# Patient Record
Sex: Female | Born: 1940 | Race: White | Hispanic: No | Smoking: Former smoker
Health system: Southern US, Community
[De-identification: ages and names within clinical notes are randomized; demographics above are authoritative.]

## PROBLEM LIST (undated history)

## (undated) DIAGNOSIS — F32A Depression, unspecified: Secondary | ICD-10-CM

## (undated) DIAGNOSIS — E039 Hypothyroidism, unspecified: Secondary | ICD-10-CM

## (undated) DIAGNOSIS — M1990 Unspecified osteoarthritis, unspecified site: Secondary | ICD-10-CM

## (undated) DIAGNOSIS — H8109 Meniere's disease, unspecified ear: Secondary | ICD-10-CM

## (undated) DIAGNOSIS — I10 Essential (primary) hypertension: Secondary | ICD-10-CM

## (undated) DIAGNOSIS — K219 Gastro-esophageal reflux disease without esophagitis: Secondary | ICD-10-CM

## (undated) DIAGNOSIS — G8929 Other chronic pain: Secondary | ICD-10-CM

## (undated) DIAGNOSIS — F419 Anxiety disorder, unspecified: Secondary | ICD-10-CM

## (undated) DIAGNOSIS — F329 Major depressive disorder, single episode, unspecified: Secondary | ICD-10-CM

## (undated) DIAGNOSIS — H9190 Unspecified hearing loss, unspecified ear: Secondary | ICD-10-CM

## (undated) DIAGNOSIS — M25511 Pain in right shoulder: Secondary | ICD-10-CM

## (undated) HISTORY — PX: HAND SURGERY: SHX662

## (undated) HISTORY — PX: APPENDECTOMY: SHX54

## (undated) HISTORY — PX: JOINT REPLACEMENT: SHX530

## (undated) HISTORY — PX: BREAST MASS EXCISION: SHX1267

## (undated) HISTORY — PX: EYE SURGERY: SHX253

## (undated) HISTORY — PX: CHOLECYSTECTOMY: SHX55

---

## 2014-02-20 ENCOUNTER — Other Ambulatory Visit: Payer: Self-pay | Admitting: Orthopedic Surgery

## 2014-02-20 DIAGNOSIS — M545 Low back pain: Secondary | ICD-10-CM

## 2014-03-02 ENCOUNTER — Ambulatory Visit
Admission: RE | Admit: 2014-03-02 | Discharge: 2014-03-02 | Disposition: A | Discharge status: Home / Self Care | Payer: Medicare Other | Source: Ambulatory Visit | Attending: Orthopedic Surgery | Admitting: Orthopedic Surgery

## 2014-03-02 DIAGNOSIS — M545 Low back pain: Secondary | ICD-10-CM

## 2014-06-10 ENCOUNTER — Other Ambulatory Visit: Payer: Self-pay | Admitting: Orthopedic Surgery

## 2014-06-10 DIAGNOSIS — M25511 Pain in right shoulder: Secondary | ICD-10-CM

## 2014-06-13 ENCOUNTER — Ambulatory Visit
Admission: RE | Admit: 2014-06-13 | Discharge: 2014-06-13 | Disposition: A | Discharge status: Home / Self Care | Payer: Medicare Other | Source: Ambulatory Visit | Attending: Orthopedic Surgery | Admitting: Orthopedic Surgery

## 2014-06-13 DIAGNOSIS — M25511 Pain in right shoulder: Secondary | ICD-10-CM

## 2014-06-22 ENCOUNTER — Other Ambulatory Visit: Payer: Medicare Other

## 2014-06-23 ENCOUNTER — Other Ambulatory Visit: Payer: Self-pay | Admitting: Physician Assistant

## 2014-07-01 ENCOUNTER — Encounter (HOSPITAL_BASED_OUTPATIENT_CLINIC_OR_DEPARTMENT_OTHER): Payer: Self-pay | Admitting: *Deleted

## 2014-07-01 ENCOUNTER — Other Ambulatory Visit: Payer: Self-pay

## 2014-07-01 ENCOUNTER — Encounter (HOSPITAL_BASED_OUTPATIENT_CLINIC_OR_DEPARTMENT_OTHER)
Admission: RE | Admit: 2014-07-01 | Discharge: 2014-07-01 | Disposition: A | Discharge status: Home / Self Care | Payer: Medicare Other | Source: Ambulatory Visit | Attending: Orthopedic Surgery | Admitting: Orthopedic Surgery

## 2014-07-01 DIAGNOSIS — K219 Gastro-esophageal reflux disease without esophagitis: Secondary | ICD-10-CM | POA: Diagnosis not present

## 2014-07-01 DIAGNOSIS — S4991XA Unspecified injury of right shoulder and upper arm, initial encounter: Secondary | ICD-10-CM | POA: Diagnosis not present

## 2014-07-01 DIAGNOSIS — M19011 Primary osteoarthritis, right shoulder: Secondary | ICD-10-CM | POA: Diagnosis not present

## 2014-07-01 DIAGNOSIS — I10 Essential (primary) hypertension: Secondary | ICD-10-CM | POA: Diagnosis not present

## 2014-07-01 DIAGNOSIS — X58XXXA Exposure to other specified factors, initial encounter: Secondary | ICD-10-CM | POA: Diagnosis not present

## 2014-07-01 DIAGNOSIS — Z87891 Personal history of nicotine dependence: Secondary | ICD-10-CM | POA: Diagnosis not present

## 2014-07-01 DIAGNOSIS — E039 Hypothyroidism, unspecified: Secondary | ICD-10-CM | POA: Diagnosis not present

## 2014-07-01 LAB — BASIC METABOLIC PANEL
Anion gap: 6 (ref 5–15)
BUN: 18 mg/dL (ref 6–20)
CO2: 30 mmol/L (ref 22–32)
Calcium: 8.8 mg/dL — ABNORMAL LOW (ref 8.9–10.3)
Chloride: 100 mmol/L — ABNORMAL LOW (ref 101–111)
Creatinine, Ser: 1.05 mg/dL — ABNORMAL HIGH (ref 0.44–1.00)
GFR calc non Af Amer: 52 mL/min — ABNORMAL LOW (ref >60)
GFR, EST AFRICAN AMERICAN: 60 mL/min — AB (ref >60)
Glucose, Bld: 89 mg/dL (ref 65–99)
Potassium: 3.9 mmol/L (ref 3.5–5.1)
Sodium: 136 mmol/L (ref 135–145)

## 2014-07-02 NOTE — H&P (Signed)
Katelyn Olsen/[facility] O[facility] [address]   [address] [place], [place] [phone] A Division of [facility]  Katelyn Aveaniel F. Shamir Sedlar, M.D.   Robert A. Thurston HoleWainer, M.D.   Burnell BlanksW. Dan Caffrey, M.D.   Eulas PostJoshua P. Landau, M.D.   Lunette StandsAnna Voytek, M.D Jewel Baizeimothy D. Eulah PontMurphy, M.D.  Buford DresserWesley R. Ibazebo, M.D.  Estell HarpinJames S. Kramer, M.D.    Melina Fiddlerebecca S. Bassett, M.D. Janalee DaneBrittney Kelly, PA- C  Mary L. Dub MikesStanbery, PA-C  Kirstin A. Shepperson, PA-C  Josh Tuske[place]well, PA-C  [place]andon Parry, [place]-C   RE: Katelyn Olsen, Katelyn Olsen                                [phone]      DOB: March 02, 1940 PROGRESS NOTE: 06-10-14 Katelyn Olsen is a 74 year-old female.  Coming in with a new problem.  Accompanied by her husband.  History of increasing episodes of worsening spinal stenosis and falling.  Also a history of Mnire's disease.  She has had at least two falls recently and one back in March.  All of these when she would catch her toes and then falling forward.  Landing facedown and injuring both shoulders.  Initial fall was March 12th, then again on April 17th and then on April 27th when she was seen at Urgent Care because of increasing pain in both shoulders, right greater than left.  She subsequently removed all of the throw rugs in her house, which is what she was catching her feet on and falling.  She was seen in Urgent Care on the 27th of April.  Had x-rays of the right shoulder.  She had a Cortisone injection which she states really didn't help at all, even with Marcaine in place.  The left shoulder is improving a little bit.  She is getting more and more motion.  On the right however she cannot bring her arm behind her back.  Rest pain and night pain.  Presents for evaluation.   Remaining history is reviewed, updated and included in the chart. [place]  She has had bilateral knee replacements; the right in 2005, the left in 2009, done in [place].  The left has done well, but the right has  never done as well.  I am not sure if that is because of her replacement or because of her spinal stenosis symptoms.  She is seen today with her husband.    EXAMINATION: General exam is outlined and included in the chart.  Specifically, 74 year-old female.  She walks with a forward flexed posture because of her spinal stenosis.  The left shoulder has about 80% active and 90% passive range of motion.  There is no instability.  There is some cuff irritation, but not that dramatic.  The right however only has about 50% active motion and that is painful.  A little bit better passively.  Marked cuff irritation and weakness.  No instability.  Neurovascularly intact distally.   X-RAYS: Three view x-ray of both shoulders shows reasonable subacromial space.  Type II acromion.  Not a lot of degenerative changes in either shoulder.  There is some calcification or bony ossification at her rotator cuff attachment on the right.    DISPOSITION:  1. We have talked about her spinal stenosis.  We have talked about safety.  We have talked about falling. 2. Bilateral knee replacements I am not addressing now.  3. Injury of both  shoulders.  Posttraumatic impingement on the left that is improving.  On the right I am very concerned that she has a complete cuff tear.  She is getting worse rather than better.  I am going to go ahead and get an MRI scan of her right shoulder.  She is going to follow up with me when that is complete.  Tailor recommendation of further workup and treatment depending on what that shows.  I went over all of this with her and her husband, spending more than 25 minutes face-to-face.    Katelyn Olsen, M.D.   Electronically verified by Katelyn Olsen, M.D. DFM:jjh D 06-10-14 T 06-11-14  Katelyn Olsen [address]   [address] [place], [place] [address] [phone] A Division of [facility]  Katelyn Olsen, M.D.   Robert  A. Thurston Hole, M.D.   Burnell Blanks, M.D.   Eulas Post, M.D.   Lunette Stands, M.D Jewel Baize. Eulah Pont, M.D.  Buford Dresser, M.D.  Estell Harpin, M.D.    Melina Fiddler, M.D. Janalee Dane, PA- C  Mary L. Dub Mikes, PA-C  Kirstin A. Shepperson, PA-C  Josh [place], PA-C  [place], [place]   RE: Katelyn, Olsen                                [phone]      DOB: [birth year] PROGRESS NOTE: 06-17-14 Katelyn Olsen is a pleasant 74 year-old female who presents to our clinic today to review MRI of her right shoulder.  MRI shows moderate diffuse supraspinatus muscular atrophy without cuff tear.  However, it does show a labral tear.  Because Katelyn Olsen has been in so much pain and because of the nature of her fall this MRI was ordered.  At this point in time we feel it is most appropriate to proceed with a right shoulder arthroscopic decompression and debridement of her labral tears.  Risks, benefits and possible complications of surgery have been reviewed.  Rehab and recovery time discussed.  Paperwork complete.  In the meantime we are proceeding with a 1:4 subacromial injection in hopes of giving Katelyn Olsen some relief.  If this gives her great enough relief she is going to call and cancel surgery.  However, we are fairly certain that the relief from the injection will not be longstanding.    PROCEDURE NOTE: The patient's clinical condition is marked by substantial pain and/or significant functional disability.  Other conservative therapy has not provided relief, is contraindicated, or not appropriate.  There is a reasonable likelihood that injection will significantly improve the patient's pain and/or functional disability. Patient is seated on the exam table.  The right shoulder is prepped with Betadine and alcohol and injected into the subacromial space with 1:4 Depo-Medrol/Marcaine.  Patient tolerated the procedure without difficulty.   Katelyn Olsen, M.D.   Dictated by: Tessa Lerner,  PA-C Electronically verified by Katelyn Olsen, M.D. DFM(LS):jjh D 06-17-14 T 06-18-14

## 2014-07-03 ENCOUNTER — Encounter (HOSPITAL_BASED_OUTPATIENT_CLINIC_OR_DEPARTMENT_OTHER)
Admission: RE | Disposition: A | Discharge status: Home / Self Care | Payer: Self-pay | Source: Ambulatory Visit | Attending: Orthopedic Surgery

## 2014-07-03 ENCOUNTER — Ambulatory Visit (HOSPITAL_BASED_OUTPATIENT_CLINIC_OR_DEPARTMENT_OTHER)
Admission: RE | Admit: 2014-07-03 | Discharge: 2014-07-03 | Disposition: A | Discharge status: Home / Self Care | Payer: Medicare Other | Source: Ambulatory Visit | Attending: Orthopedic Surgery | Admitting: Orthopedic Surgery

## 2014-07-03 ENCOUNTER — Ambulatory Visit (HOSPITAL_BASED_OUTPATIENT_CLINIC_OR_DEPARTMENT_OTHER): Payer: Medicare Other | Admitting: Anesthesiology

## 2014-07-03 ENCOUNTER — Encounter (HOSPITAL_BASED_OUTPATIENT_CLINIC_OR_DEPARTMENT_OTHER): Payer: Self-pay | Admitting: Anesthesiology

## 2014-07-03 DIAGNOSIS — K219 Gastro-esophageal reflux disease without esophagitis: Secondary | ICD-10-CM | POA: Insufficient documentation

## 2014-07-03 DIAGNOSIS — S4991XA Unspecified injury of right shoulder and upper arm, initial encounter: Secondary | ICD-10-CM | POA: Insufficient documentation

## 2014-07-03 DIAGNOSIS — I10 Essential (primary) hypertension: Secondary | ICD-10-CM | POA: Insufficient documentation

## 2014-07-03 DIAGNOSIS — M19011 Primary osteoarthritis, right shoulder: Secondary | ICD-10-CM | POA: Insufficient documentation

## 2014-07-03 DIAGNOSIS — Z87891 Personal history of nicotine dependence: Secondary | ICD-10-CM | POA: Insufficient documentation

## 2014-07-03 DIAGNOSIS — X58XXXA Exposure to other specified factors, initial encounter: Secondary | ICD-10-CM | POA: Insufficient documentation

## 2014-07-03 DIAGNOSIS — E039 Hypothyroidism, unspecified: Secondary | ICD-10-CM | POA: Insufficient documentation

## 2014-07-03 HISTORY — PX: SHOULDER ARTHROSCOPY WITH BICEPSTENOTOMY: SHX6204

## 2014-07-03 HISTORY — DX: Gastro-esophageal reflux disease without esophagitis: K21.9

## 2014-07-03 HISTORY — DX: Anxiety disorder, unspecified: F41.9

## 2014-07-03 HISTORY — DX: Major depressive disorder, single episode, unspecified: F32.9

## 2014-07-03 HISTORY — PX: SHOULDER ARTHROSCOPY WITH DISTAL CLAVICLE RESECTION: SHX5675

## 2014-07-03 HISTORY — PX: SHOULDER ARTHROSCOPY WITH SUBACROMIAL DECOMPRESSION: SHX5684

## 2014-07-03 HISTORY — DX: Depression, unspecified: F32.A

## 2014-07-03 HISTORY — DX: Meniere's disease, unspecified ear: H81.09

## 2014-07-03 HISTORY — DX: Other chronic pain: G89.29

## 2014-07-03 HISTORY — DX: Hypothyroidism, unspecified: E03.9

## 2014-07-03 HISTORY — DX: Unspecified osteoarthritis, unspecified site: M19.90

## 2014-07-03 HISTORY — DX: Unspecified hearing loss, unspecified ear: H91.90

## 2014-07-03 HISTORY — DX: Essential (primary) hypertension: I10

## 2014-07-03 HISTORY — DX: Pain in right shoulder: M25.511

## 2014-07-03 SURGERY — SHOULDER ARTHROSCOPY WITH SUBACROMIAL DECOMPRESSION
Anesthesia: Regional | Site: Shoulder | Laterality: Right

## 2014-07-03 MED ORDER — OXYCODONE HCL 5 MG/5ML PO SOLN: 5.0000 mg | Freq: Once | ORAL | Status: DC | PRN

## 2014-07-03 MED ORDER — LACTATED RINGERS IV SOLN
INTRAVENOUS | Status: DC
  Administered 2014-07-03: 09:00:00 via INTRAVENOUS

## 2014-07-03 MED ORDER — METOCLOPRAMIDE HCL 5 MG PO TABS: 5.0000 mg | ORAL_TABLET | Freq: Three times a day (TID) | ORAL | Status: DC | PRN

## 2014-07-03 MED ORDER — ONDANSETRON HCL 4 MG/2ML IJ SOLN: 4.0000 mg | Freq: Four times a day (QID) | INTRAMUSCULAR | Status: DC | PRN

## 2014-07-03 MED ORDER — BUPIVACAINE HCL (PF) 0.25 % IJ SOLN
INTRAMUSCULAR | Status: AC
  Filled 2014-07-03: qty 30

## 2014-07-03 MED ORDER — ONDANSETRON HCL 4 MG PO TABS: 4.0000 mg | ORAL_TABLET | Freq: Four times a day (QID) | ORAL | Status: DC | PRN

## 2014-07-03 MED ORDER — CEFAZOLIN SODIUM-DEXTROSE 2-3 GM-% IV SOLR
2.0000 g | INTRAVENOUS | Status: AC
  Administered 2014-07-03: 2 g via INTRAVENOUS

## 2014-07-03 MED ORDER — MIDAZOLAM HCL 2 MG/2ML IJ SOLN
INTRAMUSCULAR | Status: AC
  Filled 2014-07-03: qty 2

## 2014-07-03 MED ORDER — HYDROCODONE-ACETAMINOPHEN 5-325 MG PO TABS: 1.0000 | ORAL_TABLET | ORAL | Status: AC | PRN

## 2014-07-03 MED ORDER — ONDANSETRON HCL 4 MG PO TABS: 4.0000 mg | ORAL_TABLET | Freq: Three times a day (TID) | ORAL | Status: AC | PRN

## 2014-07-03 MED ORDER — FENTANYL CITRATE (PF) 100 MCG/2ML IJ SOLN
50.0000 ug | INTRAMUSCULAR | Status: DC | PRN
  Administered 2014-07-03: 50 ug via INTRAVENOUS

## 2014-07-03 MED ORDER — DEXAMETHASONE SODIUM PHOSPHATE 4 MG/ML IJ SOLN
INTRAMUSCULAR | Status: DC | PRN
  Administered 2014-07-03: 10 mg via INTRAVENOUS

## 2014-07-03 MED ORDER — BUPIVACAINE-EPINEPHRINE (PF) 0.5% -1:200000 IJ SOLN
INTRAMUSCULAR | Status: DC | PRN
  Administered 2014-07-03: 30 mL via PERINEURAL

## 2014-07-03 MED ORDER — CHLORHEXIDINE GLUCONATE 4 % EX LIQD: 60.0000 mL | Freq: Once | CUTANEOUS | Status: DC

## 2014-07-03 MED ORDER — METHOCARBAMOL 1000 MG/10ML IJ SOLN: 500.0000 mg | Freq: Four times a day (QID) | INTRAVENOUS | Status: DC | PRN

## 2014-07-03 MED ORDER — MIDAZOLAM HCL 2 MG/2ML IJ SOLN
1.0000 mg | INTRAMUSCULAR | Status: DC | PRN
  Administered 2014-07-03: 1 mg via INTRAVENOUS

## 2014-07-03 MED ORDER — OXYCODONE HCL 5 MG PO TABS: 5.0000 mg | ORAL_TABLET | Freq: Once | ORAL | Status: DC | PRN

## 2014-07-03 MED ORDER — PROPOFOL 10 MG/ML IV BOLUS
INTRAVENOUS | Status: DC | PRN
  Administered 2014-07-03: 130 mg via INTRAVENOUS

## 2014-07-03 MED ORDER — FENTANYL CITRATE (PF) 100 MCG/2ML IJ SOLN
INTRAMUSCULAR | Status: DC | PRN
  Administered 2014-07-03: 25 ug via INTRAVENOUS

## 2014-07-03 MED ORDER — OXYCODONE-ACETAMINOPHEN 5-325 MG PO TABS: 1.0000 | ORAL_TABLET | ORAL | Status: AC | PRN

## 2014-07-03 MED ORDER — SUCCINYLCHOLINE CHLORIDE 20 MG/ML IJ SOLN
INTRAMUSCULAR | Status: DC | PRN
  Administered 2014-07-03: 100 mg via INTRAVENOUS

## 2014-07-03 MED ORDER — CEFAZOLIN SODIUM-DEXTROSE 2-3 GM-% IV SOLR
INTRAVENOUS | Status: AC
  Filled 2014-07-03: qty 50

## 2014-07-03 MED ORDER — FENTANYL CITRATE (PF) 100 MCG/2ML IJ SOLN
INTRAMUSCULAR | Status: AC
  Filled 2014-07-03: qty 2

## 2014-07-03 MED ORDER — METHOCARBAMOL 500 MG PO TABS: 500.0000 mg | ORAL_TABLET | Freq: Four times a day (QID) | ORAL | Status: DC | PRN

## 2014-07-03 MED ORDER — METOCLOPRAMIDE HCL 5 MG/ML IJ SOLN: 5.0000 mg | Freq: Three times a day (TID) | INTRAMUSCULAR | Status: DC | PRN

## 2014-07-03 MED ORDER — HYDROMORPHONE HCL 1 MG/ML IJ SOLN: 0.5000 mg | INTRAMUSCULAR | Status: DC | PRN

## 2014-07-03 MED ORDER — OXYCODONE-ACETAMINOPHEN 5-325 MG PO TABS: 1.0000 | ORAL_TABLET | ORAL | Status: DC | PRN

## 2014-07-03 MED ORDER — FENTANYL CITRATE (PF) 100 MCG/2ML IJ SOLN: 25.0000 ug | INTRAMUSCULAR | Status: DC | PRN

## 2014-07-03 MED ORDER — LIDOCAINE HCL (CARDIAC) 20 MG/ML IV SOLN
INTRAVENOUS | Status: DC | PRN
  Administered 2014-07-03: 80 mg via INTRAVENOUS

## 2014-07-03 MED ORDER — GLYCOPYRROLATE 0.2 MG/ML IJ SOLN: 0.2000 mg | Freq: Once | INTRAMUSCULAR | Status: DC | PRN

## 2014-07-03 MED ORDER — FENTANYL CITRATE (PF) 100 MCG/2ML IJ SOLN
INTRAMUSCULAR | Status: AC
Start: 2014-07-03 — End: 2014-07-03
  Filled 2014-07-03: qty 2

## 2014-07-03 SURGICAL SUPPLY — 71 items
BENZOIN TINCTURE PRP APPL 2/3 (GAUZE/BANDAGES/DRESSINGS) IMPLANT
BLADE CUTTER GATOR 3.5 (BLADE) ×3 IMPLANT
BLADE CUTTER MENIS 5.5 (BLADE) IMPLANT
BLADE GREAT WHITE 4.2 (BLADE) ×2 IMPLANT
BLADE GREAT WHITE 4.2MM (BLADE) ×1
BLADE SURG 15 STRL LF DISP TIS (BLADE) ×1 IMPLANT
BLADE SURG 15 STRL SS (BLADE) ×2
BUR OVAL 6.0 (BURR) ×3 IMPLANT
CANNULA DRY DOC 8X75 (CANNULA) IMPLANT
CANNULA TWIST IN 8.25X7CM (CANNULA) IMPLANT
CLOSURE WOUND 1/2 X4 (GAUZE/BANDAGES/DRESSINGS)
DECANTER SPIKE VIAL GLASS SM (MISCELLANEOUS) IMPLANT
DRAPE STERI 35X30 U-POUCH (DRAPES) ×3 IMPLANT
DRAPE U-SHAPE 47X51 STRL (DRAPES) ×3 IMPLANT
DRAPE U-SHAPE 76X120 STRL (DRAPES) ×6 IMPLANT
DRSG PAD ABDOMINAL 8X10 ST (GAUZE/BANDAGES/DRESSINGS) ×3 IMPLANT
DURAPREP 26ML APPLICATOR (WOUND CARE) ×3 IMPLANT
ELECT MENISCUS 165MM 90D (ELECTRODE) ×3 IMPLANT
ELECT REM PT RETURN 9FT ADLT (ELECTROSURGICAL) ×3
ELECTRODE REM PT RTRN 9FT ADLT (ELECTROSURGICAL) ×1 IMPLANT
GAUZE SPONGE 4X4 12PLY STRL (GAUZE/BANDAGES/DRESSINGS) ×6 IMPLANT
GAUZE XEROFORM 1X8 LF (GAUZE/BANDAGES/DRESSINGS) ×3 IMPLANT
GLOVE BIO SURGEON STRL SZ 6.5 (GLOVE) ×2 IMPLANT
GLOVE BIO SURGEONS STRL SZ 6.5 (GLOVE) ×1
GLOVE BIOGEL PI IND STRL 7.0 (GLOVE) ×3 IMPLANT
GLOVE BIOGEL PI INDICATOR 7.0 (GLOVE) ×6
GLOVE ECLIPSE 7.0 STRL STRAW (GLOVE) ×3 IMPLANT
GLOVE ORTHO TXT STRL SZ7.5 (GLOVE) ×3 IMPLANT
GLOVE SURG ORTHO 8.0 STRL STRW (GLOVE) ×3 IMPLANT
GOWN STRL REUS W/ TWL LRG LVL3 (GOWN DISPOSABLE) ×2 IMPLANT
GOWN STRL REUS W/ TWL XL LVL3 (GOWN DISPOSABLE) ×1 IMPLANT
GOWN STRL REUS W/TWL LRG LVL3 (GOWN DISPOSABLE) ×4
GOWN STRL REUS W/TWL XL LVL3 (GOWN DISPOSABLE) ×2
IV NS IRRIG 3000ML ARTHROMATIC (IV SOLUTION) ×12 IMPLANT
MANIFOLD NEPTUNE II (INSTRUMENTS) ×3 IMPLANT
NDL SUT 6 .5 CRC .975X.05 MAYO (NEEDLE) IMPLANT
NEEDLE MAYO TAPER (NEEDLE)
NEEDLE SCORPION MULTI FIRE (NEEDLE) IMPLANT
NS IRRIG 1000ML POUR BTL (IV SOLUTION) IMPLANT
PACK ARTHROSCOPY DSU (CUSTOM PROCEDURE TRAY) ×3 IMPLANT
PACK BASIN DAY SURGERY FS (CUSTOM PROCEDURE TRAY) ×3 IMPLANT
PASSER SUT SWANSON 36MM LOOP (INSTRUMENTS) IMPLANT
PENCIL BUTTON HOLSTER BLD 10FT (ELECTRODE) ×3 IMPLANT
SET ARTHROSCOPY TUBING (MISCELLANEOUS) ×2
SET ARTHROSCOPY TUBING LN (MISCELLANEOUS) ×1 IMPLANT
SLEEVE SCD COMPRESS KNEE MED (MISCELLANEOUS) IMPLANT
SLING ARM IMMOBILIZER LRG (SOFTGOODS) IMPLANT
SLING ARM IMMOBILIZER MED (SOFTGOODS) IMPLANT
SLING ARM LRG ADULT FOAM STRAP (SOFTGOODS) IMPLANT
SLING ARM MED ADULT FOAM STRAP (SOFTGOODS) ×3 IMPLANT
SLING ARM XL FOAM STRAP (SOFTGOODS) IMPLANT
SPONGE LAP 4X18 X RAY DECT (DISPOSABLE) IMPLANT
STRIP CLOSURE SKIN 1/2X4 (GAUZE/BANDAGES/DRESSINGS) IMPLANT
SUCTION FRAZIER TIP 10 FR DISP (SUCTIONS) IMPLANT
SUT ETHIBOND 2 OS 4 DA (SUTURE) IMPLANT
SUT ETHILON 2 0 FS 18 (SUTURE) IMPLANT
SUT ETHILON 3 0 PS 1 (SUTURE) IMPLANT
SUT FIBERWIRE #2 38 T-5 BLUE (SUTURE)
SUT RETRIEVER MED (INSTRUMENTS) IMPLANT
SUT TIGER TAPE 7 IN WHITE (SUTURE) IMPLANT
SUT VIC AB 0 CT1 27 (SUTURE)
SUT VIC AB 0 CT1 27XBRD ANBCTR (SUTURE) IMPLANT
SUT VIC AB 2-0 SH 27 (SUTURE)
SUT VIC AB 2-0 SH 27XBRD (SUTURE) IMPLANT
SUT VIC AB 3-0 FS2 27 (SUTURE) IMPLANT
SUTURE FIBERWR #2 38 T-5 BLUE (SUTURE) IMPLANT
TAPE FIBER 2MM 7IN #2 BLUE (SUTURE) IMPLANT
TOWEL OR 17X24 6PK STRL BLUE (TOWEL DISPOSABLE) ×3 IMPLANT
TOWEL OR NON WOVEN STRL DISP B (DISPOSABLE) ×3 IMPLANT
WATER STERILE IRR 1000ML POUR (IV SOLUTION) ×3 IMPLANT
YANKAUER SUCT BULB TIP NO VENT (SUCTIONS) IMPLANT

## 2014-07-03 NOTE — Anesthesia Postprocedure Evaluation (Signed)
Anesthesia Post Note  Patient: Katelyn Olsen  Procedure(s) Performed: Procedure(s) (LRB): SHOULDER ARTHROSCOPY WITH SUBACROMIAL DECOMPRESSION (Right) SHOULDER ARTHROSCOPY WITH DISTAL CLAVICLE RESECTION (Right) SHOULDER ARTHROSCOPY WITH BICEPSTENOTOMY DEBRIDEMENT LABRUM EXCISION CALCIFIC DEPOSITS (Right)  Anesthesia type: General  Patient location: PACU  Post pain: Pain level controlled and Adequate analgesia  Post assessment: Post-op Vital signs reviewed, Patient's Cardiovascular Status Stable, Respiratory Function Stable, Patent Airway and Pain level controlled  Last Vitals:  Filed Vitals:   07/03/14 1137  BP:   Pulse: 79  Temp:   Resp: 19    Post vital signs: Reviewed and stable  Level of consciousness: awake, alert  and oriented  Complications: No apparent anesthesia complications

## 2014-07-03 NOTE — Discharge Instructions (Signed)
Shouder arthroscopy, partial rotator cuff tear debridement subacromial decompression °Care After Instructions °Refer to this sheet in the next few weeks. These discharge instructions provide you with general information on caring for yourself after you leave the hospital. Your caregiver may also give you specific instructions. Your treatment has been planned according to the most current medical practices available, but unavoidable complications sometimes occur. If you have any problems or questions after discharge, please call your caregiver. °HOME INSTRUCTIONS °You may resume a normal diet and activities as directed. Take showers instead of baths until informed otherwise.  °Change bandages (dressings) in 3 days.  Swab wounds daily with betadine.  Wash shoulder with soap and water.  Pat dry.  Cover wounds with bandaids. °Only take over-the-counter or prescription medicines for pain, discomfort, or fever as directed by your caregiver.  °Wear your sling for the next 2 days unless otherwise instructed. °Eat a well-balanced diet.  °Avoid lifting or driving until you are instructed otherwise.  °Make an appointment to see your caregiver for stitches (suture) or staple removal one week after surgery. ° °SEEK MEDICAL CARE IF: °You have swelling of your calf or leg.  °You develop shortness of breath or chest pain.  °You have redness, swelling, or increasing pain in the wound.  °There is pus or any unusual drainage coming from the surgical site.  °You notice a bad smell coming from the surgical site or dressing.  °The surgical site breaks open after sutures or staples have been removed.  °There is persistent bleeding from the suture or staple line.  °You are getting worse or are not improving.  °You have any other questions or concerns.  °SEEK IMMEDIATE MEDICAL CARE IF:  °You have a fever greater than 101 °You develop a rash.  °You have difficulty breathing.  °You develop any reaction or side effects to medicines given.    °Your knee motion is decreasing rather than improving.  °MAKE SURE YOU:  °Understand these instructions.  °Will watch your condition.  °Will get help right away if you are not doing well or get worse.  ° °Regional Anesthesia Blocks ° °1. Numbness or the inability to move the "blocked" extremity may last from 3-48 hours after placement. The length of time depends on the medication injected and your individual response to the medication. If the numbness is not going away after 48 hours, call your surgeon. ° °2. The extremity that is blocked will need to be protected until the numbness is gone and the  Strength has returned. Because you cannot feel it, you will need to take extra care to avoid injury. Because it may be weak, you may have difficulty moving it or using it. You may not know what position it is in without looking at it while the block is in effect. ° °3. For blocks in the legs and feet, returning to weight bearing and walking needs to be done carefully. You will need to wait until the numbness is entirely gone and the strength has returned. You should be able to move your leg and foot normally before you try and bear weight or walk. You will need someone to be with you when you first try to ensure you do not fall and possibly risk injury. ° °4. Bruising and tenderness at the needle site are common side effects and will resolve in a few days. ° °5. Persistent numbness or new problems with movement should be communicated to the surgeon or the [facility] ([phone])/    Surgery Center ([phone]). ° ° °Post Anesthesia Home Care Instructions ° °Activity: °Get plenty of rest for the remainder of the day. A responsible adult should stay with you for 24 hours following the procedure.  °For the next 24 hours, DO NOT: °-Drive a car °-Operate machinery °-Drink alcoholic beverages °-Take any medication unless instructed by your physician °-Make any legal decisions or sign important  papers. ° °Meals: °Start with liquid foods such as gelatin or soup. Progress to regular foods as tolerated. Avoid greasy, spicy, heavy foods. If nausea and/or vomiting occur, drink only clear liquids until the nausea and/or vomiting subsides. Call your physician if vomiting continues. ° °Special Instructions/Symptoms: °Your throat may feel dry or sore from the anesthesia or the breathing tube placed in your throat during surgery. If this causes discomfort, gargle with warm salt water. The discomfort should disappear within 24 hours. ° °If you had a scopolamine patch placed behind your ear for the management of post- operative nausea and/or vomiting: ° °1. The medication in the patch is effective for 72 hours, after which it should be removed.  Wrap patch in a tissue and discard in the trash. Wash hands thoroughly with soap and water. °2. You may remove the patch earlier than 72 hours if you experience unpleasant side effects which may include dry mouth, dizziness or visual disturbances. °3. Avoid touching the patch. Wash your hands with soap and water after contact with the patch. °  ° °

## 2014-07-03 NOTE — Progress Notes (Signed)
Assisted Dr. Hodierne with right, ultrasound guided, interscalene  block. Side rails up, monitors on throughout procedure. See vital signs in flow sheet. Tolerated Procedure well. 

## 2014-07-03 NOTE — Anesthesia Preprocedure Evaluation (Signed)
Anesthesia Evaluation  Patient identified by MRN, date of birth, ID band Patient awake    Reviewed: Allergy & Precautions, NPO status , Patient's Chart, lab work & pertinent test results  Airway Mallampati: II   Neck ROM: full    Dental   Pulmonary former smoker,  breath sounds clear to auscultation        Cardiovascular hypertension, Rhythm:regular Rate:Normal     Neuro/Psych Anxiety Depression    GI/Hepatic GERD-  ,  Endo/Other  Hypothyroidism   Renal/GU      Musculoskeletal  (+) Arthritis -,   Abdominal   Peds  Hematology   Anesthesia Other Findings   Reproductive/Obstetrics                             Anesthesia Physical Anesthesia Plan  ASA: II  Anesthesia Plan: General and Regional   Post-op Pain Management: MAC Combined w/ Regional for Post-op pain   Induction: Intravenous  Airway Management Planned: Oral ETT  Additional Equipment:   Intra-op Plan:   Post-operative Plan: Extubation in OR  Informed Consent: I have reviewed the patients History and Physical, chart, labs and discussed the procedure including the risks, benefits and alternatives for the proposed anesthesia with the patient or authorized representative who has indicated his/her understanding and acceptance.     Plan Discussed with: CRNA, Anesthesiologist and Surgeon  Anesthesia Plan Comments:         Anesthesia Quick Evaluation

## 2014-07-03 NOTE — Interval H&P Note (Signed)
History and Physical Interval Note:  07/03/2014 7:39 AM  Katelyn Olsen  has presented today for surgery, with the diagnosis of OTHER ARTICULAR CARTILAGE DISORDERS RIGHT SHOULDER/PRIMARY OSTEOARTHRITIS UNSPECIFIED SITE/IMPINGEMENT SYNDROME OF RIGHT SHOULDER/STRAIN OF MUSCLE (S) AND TENDON (S) OF THE ROTATOR CUFF   The various methods of treatment have been discussed with the patient and family. After consideration of risks, benefits and other options for treatment, the patient has consented to  Procedure(s): RIGHT SHOULDER SCOPE DEBRIDEMENT/DISTAL CLAVICAL EXCISION/ACROMIOPLASTY/ROTATOR CUFF REPAIR AND BICEP TENODESIS (Right) as a surgical intervention .  The patient's history has been reviewed, patient examined, no change in status, stable for surgery.  I have reviewed the patient's chart and labs.  Questions were answered to the patient's satisfaction.     Jaleia Hanke F

## 2014-07-03 NOTE — Transfer of Care (Signed)
Immediate Anesthesia Transfer of Care Note  Patient: Katelyn Olsen  Procedure(s) Performed: Procedure(s): RIGHT SHOULDER SCOPE DEBRIDEMENT/DISTAL CLAVICAL EXCISION/ACROMIOPLASTY/ROTATOR CUFF REPAIR AND BICEP TENODESIS (Right)  Patient Location: PACU  Anesthesia Type:General  Level of Consciousness: awake and sedated  Airway & Oxygen Therapy: Patient Spontanous Breathing and Patient connected to face mask oxygen  Post-op Assessment: Report given to RN and Post -op Vital signs reviewed and stable  Post vital signs: Reviewed and stable  Last Vitals:  Filed Vitals:   07/03/14 0910  BP:   Pulse: 76  Temp:   Resp: 19    Complications: No apparent anesthesia complications

## 2014-07-03 NOTE — Anesthesia Procedure Notes (Addendum)
Procedure Name: Intubation Performed by: York GricePEARSON, DONNA W Pre-anesthesia Checklist: Patient identified, Timeout performed, Emergency Drugs available, Suction available and Patient being monitored Patient Re-evaluated:Patient Re-evaluated prior to inductionOxygen Delivery Method: Circle system utilized Preoxygenation: Pre-oxygenation with 100% oxygen Intubation Type: IV induction Ventilation: Mask ventilation without difficulty Laryngoscope Size: Miller and 2 Grade View: Grade I Tube type: Oral Number of attempts: 1 Airway Equipment and Method: Stylet Placement Confirmation: ETT inserted through vocal cords under direct vision,  positive ETCO2 and breath sounds checked- equal and bilateral Secured at: 23 cm Tube secured with: Tape Dental Injury: Teeth and Oropharynx as per pre-operative assessment    Anesthesia Regional Block:  Interscalene brachial plexus block  Pre-Anesthetic Checklist: ,, timeout performed, Correct Patient, Correct Site, Correct Laterality, Correct Procedure, Correct Position, site marked, Risks and benefits discussed,  Surgical consent,  Pre-op evaluation,  At surgeon's request and post-op pain management  Laterality: Right  Prep: chloraprep       Needles:  Injection technique: Single-shot  Needle Type: Echogenic Stimulator Needle     Needle Length: 5cm 5 cm Needle Gauge: 22 and 22 G    Additional Needles:  Procedures: ultrasound guided (picture in chart) and nerve stimulator Interscalene brachial plexus block  Nerve Stimulator or Paresthesia:  Response: biceps flexion, 0.45 mA,   Additional Responses:   Narrative:  Start time: 07/03/2014 8:42 AM End time: 07/03/2014 8:53 AM Injection made incrementally with aspirations every 5 mL.  Performed by: Personally  Anesthesiologist: Melisa Donofrio  Additional Notes: Functioning IV was confirmed and monitors were applied.  A 50mm 22ga Arrow echogenic stimulator needle was used. Sterile prep and  drape,hand hygiene and sterile gloves were used.  Negative aspiration and negative test dose prior to incremental administration of local anesthetic. The patient tolerated the procedure well.  Ultrasound guidance: relevent anatomy identified, needle position confirmed, local anesthetic spread visualized around nerve(s), vascular puncture avoided.  Image printed for medical record.

## 2014-07-04 ENCOUNTER — Encounter (HOSPITAL_BASED_OUTPATIENT_CLINIC_OR_DEPARTMENT_OTHER): Payer: Self-pay | Admitting: Orthopedic Surgery

## 2014-07-04 NOTE — Op Note (Signed)
NAME:  Katelyn Olsen, Katelyn Olsen NO.:  [id number]  MEDICAL RECORD NO.:  [id number]  LOCATION:                                FACILITY:  [facility]  PHYSICIAN:  Loreta Ave, M.D. DATE OF BIRTH:  September 22, 1940  DATE OF PROCEDURE:  07/03/2014 DATE OF DISCHARGE:  07/03/2014                              OPERATIVE REPORT   PREOPERATIVE DIAGNOSES:  Traumatic injury, right shoulder.  Extensive tearing of posterior labrum with superior labrum anterior and posterior tear.  Partial tearing, rotator cuff with calcific deposit, lateral supraspinatus tendon.  Impingement.  Degenerative joint disease of acromioclavicular joint.  POSTOPERATIVE DIAGNOSES:  Traumatic injury, right shoulder.  Extensive tearing of posterior labrum with superior labrum anterior and posterior tear.  Partial tearing, rotator cuff with calcific deposit, lateral supraspinatus tendon.  Impingement.  Degenerative joint disease of acromioclavicular joint.  Also grade 3 changes in the glenohumeral joint, glenoid and numerous, small focal grade 4 area on the inferior glenoid with chondral loose bodies.  PROCEDURES:  Right shoulder exam under anesthesia, arthroscopy. Debridement of labrum, superior labrum anterior and posterior tear. Release resection of intra-articular portion of biceps tendon. Chondroplasty and removal of loose bodies.  Debridement of rotator cuff above and below with excision of calcific deposit from superior supraspinatus tendon.  Bursectomy, acromioplasty, coracoacromial ligament release.  Excision of distal clavicle.  SURGEON:  Loreta Ave, M.D.  ASSISTANT:  Katelyn Olsen, P.A., present throughout the entire case and necessary for timely completion of procedure.  ANESTHESIA:  General.  BLOOD LOSS:  Minimal.  SPECIMENS:  None.  CULTURES:  None.  COMPLICATIONS:  None.  DRESSINGS:  Soft compressive with sling.  DESCRIPTION OF PROCEDURE:  The patient was brought to the operating  room and placed on the operating table in supine position.  After adequate anesthesia had been obtained, shoulder was examined.  Full motion with stable shoulder.  Placed in a beach-chair position, shoulder positioner, prepped and draped in usual sterile fashion.  Three portals; anterior, posterior and lateral.  Arthroscope was introduced, shoulder was distended and inspected.  Numerous chondral loose bodies debrided and removed.  Chondroplasty of glenoid and humerus.  A lot of the humeral head was grade 3.  The entire posterior labrum torn, hypermobile, extending into the superior SLAP tear.  Biceps did not look bad, but no attachment.  The entire labrum debrided.  Chondroplasty.  Released resection of intra-articular portion, biceps allowing it to recess in the bicipital groove.  Partial tearing undersurface of supraspinatus debrided.  Nothing full-thickness.  Cannula was redirected subacromially.  Reactive bursitis debrided.  Acromioplasty from type 2 to type 1 acromion, releasing the CA ligament.  Marked changes of the [facility] joint with spurring.  Periarticular spurs lateral centimeter of clavicle resected.  The cuff was then fully visualized from above.  I could easily see the calcific deposit.  Decompressed with spinal needle, debrided with a shaver.  Still sufficient tissue below, I did not feel the repair.  The cuff musculature was failed, was still intact. Fluoroscopy was used to confirm adequate excision of the calcific deposit.  Instruments and fluids were removed.  Portals were closed with nylon.  Sterile compressive dressing applied.  Anesthesia reversed. Brought  to the recovery room.  Tolerated the surgery well.  No complications.     Loreta Aveaniel F. Apryle Stowell, M.D.     DFM/MEDQ  D:  07/03/2014  T:  07/04/2014  Job:  [id number]

## 2014-08-19 ENCOUNTER — Other Ambulatory Visit: Payer: Self-pay | Admitting: Orthopedic Surgery

## 2014-08-19 DIAGNOSIS — M25511 Pain in right shoulder: Secondary | ICD-10-CM

## 2014-08-20 ENCOUNTER — Ambulatory Visit
Admission: RE | Admit: 2014-08-20 | Discharge: 2014-08-20 | Disposition: A | Discharge status: Home / Self Care | Payer: Medicare Other | Source: Ambulatory Visit | Attending: Orthopedic Surgery | Admitting: Orthopedic Surgery

## 2014-08-20 DIAGNOSIS — M25511 Pain in right shoulder: Secondary | ICD-10-CM

## 2014-08-30 ENCOUNTER — Other Ambulatory Visit: Payer: Medicare Other

## 2017-01-20 ENCOUNTER — Encounter (HOSPITAL_COMMUNITY): Payer: Self-pay | Admitting: Emergency Medicine

## 2017-01-20 ENCOUNTER — Emergency Department (HOSPITAL_COMMUNITY): Payer: Medicare Other

## 2017-01-20 ENCOUNTER — Emergency Department (HOSPITAL_COMMUNITY)
Admission: EM | Admit: 2017-01-20 | Discharge: 2017-01-20 | Disposition: A | Discharge status: Home / Self Care | Payer: Medicare Other | Attending: Emergency Medicine | Admitting: Emergency Medicine

## 2017-01-20 ENCOUNTER — Emergency Department (HOSPITAL_BASED_OUTPATIENT_CLINIC_OR_DEPARTMENT_OTHER)
Admit: 2017-01-20 | Discharge: 2017-01-20 | Disposition: A | Discharge status: Home / Self Care | Payer: Medicare Other | Attending: Emergency Medicine | Admitting: Emergency Medicine

## 2017-01-20 ENCOUNTER — Other Ambulatory Visit: Payer: Self-pay

## 2017-01-20 DIAGNOSIS — M7989 Other specified soft tissue disorders: Secondary | ICD-10-CM | POA: Diagnosis not present

## 2017-01-20 DIAGNOSIS — Z96653 Presence of artificial knee joint, bilateral: Secondary | ICD-10-CM | POA: Diagnosis not present

## 2017-01-20 DIAGNOSIS — Z87891 Personal history of nicotine dependence: Secondary | ICD-10-CM | POA: Diagnosis not present

## 2017-01-20 DIAGNOSIS — Z79899 Other long term (current) drug therapy: Secondary | ICD-10-CM | POA: Diagnosis not present

## 2017-01-20 DIAGNOSIS — E039 Hypothyroidism, unspecified: Secondary | ICD-10-CM | POA: Diagnosis not present

## 2017-01-20 DIAGNOSIS — I10 Essential (primary) hypertension: Secondary | ICD-10-CM | POA: Diagnosis not present

## 2017-01-20 DIAGNOSIS — M25561 Pain in right knee: Secondary | ICD-10-CM | POA: Diagnosis present

## 2017-01-20 DIAGNOSIS — M25461 Effusion, right knee: Secondary | ICD-10-CM | POA: Diagnosis not present

## 2017-01-20 MED ORDER — HYDROCODONE-ACETAMINOPHEN 5-325 MG PO TABS
2.0000 | ORAL_TABLET | Freq: Once | ORAL | Status: AC
  Administered 2017-01-20: 2 via ORAL
  Filled 2017-01-20: qty 2

## 2017-01-20 MED ORDER — HYDROCODONE-ACETAMINOPHEN 5-325 MG PO TABS: 1.0000 | ORAL_TABLET | ORAL | 0 refills | Status: AC | PRN

## 2017-01-20 NOTE — ED Triage Notes (Signed)
Pt verbalizes right knee pain; hx of replacement. Recently seen at orthopedic for same; had DG and medications. Pt verbalizes pain started again a few days ago; this morning worsening and "knee fell out underneath me." Unable to lift at this time.

## 2017-01-20 NOTE — Progress Notes (Signed)
*  PRELIMINARY RESULTS* Vascular Ultrasound Right lower extremity venous duplex has been completed.  Preliminary findings: No evidence of deep vein thrombosis in the visualized veins of the right lower extremity.  Complex area seen right pop fossa, approximately 3.4x 2.4cm,unknown etiology, possibly a complex Bakers cyst?  Preliminary results given to Dr. Donnald GarrePfeiffer @ 19:00 Chauncey Fischerharlotte C Leathia Farnell 01/20/2017, 6:58 PM

## 2017-01-20 NOTE — Discharge Instructions (Signed)
1.  Vicodin can cause constipation.  If you have any tendency towards constipation, take an over-the-counter stool softener, such as Colace, while taking the pain medication. 2.  Try to schedule your follow-up as soon as possible for recheck. 3.  Return to the emergency department if your symptoms are worsening or changing.

## 2017-01-20 NOTE — ED Provider Notes (Signed)
[facility]-EMERGENCY DEPT Provider Note   CSN: [id number] Arrival date & time: 01/20/17  1417     History   Chief Complaint Chief Complaint  Patient presents with  . Knee Pain    HPI Katelyn Olsen is a 76 y.o. female.  HPI Patient has history of bilateral knee replacements approximately 15 years ago.  She has never had problems with pain in the left one. Intermittently  has problems with pain and swelling in the right.  Patient is from [place].  She and her husband drove up here to help her daughter who is ill.  That was approximately 1 week ago.  After the past 2 days now she reports she has a lot of pain that makes it feel like it is hard for her to move her leg.  She reports the pain goes both up into her thigh as well down into her calf.  There has been some swelling but not redness noted.  Patient reports that she did have an injection about a month ago by her orthopedic doctor for pain but it was nowhere near as severe as this.  She reports over the course of the years she has had approximately 4 knee injections for pain in that same knee.  Never required one in the left. Past Medical History:  Diagnosis Date  . Anxiety   . Arthritis   . Chronic right shoulder pain   . Depression   . GERD (gastroesophageal reflux disease)   . HOH (hard of hearing)    wears right hearing aid  . Hypertension   . Hypothyroidism   . Meniere's disease     There are no active problems to display for this patient.   Past Surgical History:  Procedure Laterality Date  . APPENDECTOMY    . BREAST MASS EXCISION Left    benign  . CHOLECYSTECTOMY    . EYE SURGERY     cataracts  . HAND SURGERY Bilateral   . JOINT REPLACEMENT Bilateral    knees  . SHOULDER ARTHROSCOPY WITH BICEPSTENOTOMY Right 07/03/2014   Procedure: SHOULDER ARTHROSCOPY WITH BICEPSTENOTOMY DEBRIDEMENT LABRUM EXCISION CALCIFIC DEPOSITS;  Surgeon: Mckinley Jewel, MD;  Location: [facility];   Service: Orthopedics;  Laterality: Right;  . SHOULDER ARTHROSCOPY WITH DISTAL CLAVICLE RESECTION Right 07/03/2014   Procedure: SHOULDER ARTHROSCOPY WITH DISTAL CLAVICLE RESECTION;  Surgeon: Mckinley Jewel, MD;  Location: [facility];  Service: Orthopedics;  Laterality: Right;  . SHOULDER ARTHROSCOPY WITH SUBACROMIAL DECOMPRESSION Right 07/03/2014   Procedure: SHOULDER ARTHROSCOPY WITH SUBACROMIAL DECOMPRESSION;  Surgeon: Mckinley Jewel, MD;  Location:  SURGERY CENTER;  Service: Orthopedics;  Laterality: Right;    OB History    No data available       Home Medications    Prior to Admission medications   Medication Sig Start Date End Date Taking? Authorizing Provider  escitalopram (LEXAPRO) 20 MG tablet Take 10 mg by mouth daily.    Yes [provider]  lisinopril-hydrochlorothiazide (PRINZIDE,ZESTORETIC) 20-25 MG per tablet Take 1 tablet by mouth daily.   Yes [provider]  Omega 3 1000 MG CAPS Take 1,000 mg by mouth daily.    Yes [provider]  omeprazole (PRILOSEC) 20 MG capsule Take 20 mg by mouth daily.   Yes [provider]  SYNTHROID 175 MCG tablet TK 1/2 T (88 mcg) PO D 12/13/16  Yes [provider]  traMADol (ULTRAM) 50 MG tablet TK 1 T PO BID PRN FOR PAIN  01/01/17  Yes [provider]  HYDROcodone-acetaminophen (NORCO) 5-325 MG per tablet Take 1-2 tablets by mouth every 4 (four) hours as needed for moderate pain. Patient not taking: Reported on 01/20/2017 07/03/14   Cristie HemStanbery, Mary L, PA-C  HYDROcodone-acetaminophen (NORCO/VICODIN) 5-325 MG tablet Take 1-2 tablets by mouth every 4 (four) hours as needed for moderate pain or severe pain. 01/20/17   Arby BarrettePfeiffer, Averyanna Sax, MD  levothyroxine (SYNTHROID, LEVOTHROID) 88 MCG tablet Take 88 mcg by mouth daily before breakfast.    [provider]  meclizine (ANTIVERT) 25 MG tablet Take 25 mg by mouth 3 (three) times daily as needed for dizziness.    [provider]  meloxicam (MOBIC) 15 MG tablet Take 15 mg by mouth daily.    [provider]  ondansetron (ZOFRAN) 4 MG tablet Take 1 tablet (4 mg total) by mouth every 8 (eight) hours as needed for nausea or vomiting. Patient not taking: Reported on 01/20/2017 07/03/14   Cristie HemStanbery, Mary L, PA-C  oxyCODONE-acetaminophen (ROXICET) 5-325 MG per tablet Take 1-2 tablets by mouth every 4 (four) hours as needed. Patient not taking: Reported on 01/20/2017 07/03/14   Cristie HemStanbery, Mary L, PA-C    Family History No family history on file.  Social History Social History   Tobacco Use  . Smoking status: Former Smoker  Substance Use Topics  . Alcohol use: Yes    Comment: social  . Drug use: No     Allergies   Morphine and related   Review of Systems Review of Systems 10 Systems reviewed and are negative for acute change except as noted in the HPI.   Physical Exam Updated Vital Signs BP 123/73   Pulse 82   Temp 98.9 F (37.2 C) (Oral)   Resp 16   SpO2 99%   Physical Exam  Constitutional: She is oriented to person, place, and time. She appears well-developed and well-nourished. No distress.  HENT:  Head: Normocephalic and atraumatic.  Eyes: Conjunctivae and EOM are normal.  Neck: Neck supple.  Cardiovascular: Normal rate and regular rhythm.  No murmur heard. Pulmonary/Chest: Effort normal and breath sounds normal. No respiratory distress.  Abdominal: Soft. There is no tenderness.  Musculoskeletal: She exhibits no edema.  Mild to moderate effusion right knee.  Both knees have had replacements with well-healed scars.  Knee is not erythematous.  Patient endorses tenderness to the posterior calf and popliteal fossa.  Calf is soft.  Pain with range of motion radiates both up and down the leg.  Neurological: She is alert and oriented to person, place, and time. No cranial nerve deficit. She exhibits normal muscle tone. Coordination normal.  Skin: Skin is warm and dry.  Psychiatric:  She has a normal mood and affect.  Nursing note and vitals reviewed.    ED Treatments / Results  Labs (all labs ordered are listed, but only abnormal results are displayed) Labs Reviewed - No data to display  EKG  EKG Interpretation None       Radiology Dg Knee Complete 4 Views Right  Result Date: 01/20/2017 CLINICAL DATA:  Diffuse pain beginning yesterday.  No known injury. EXAM: RIGHT KNEE - COMPLETE 4+ VIEW COMPARISON:  None. FINDINGS: Small amount of joint fluid. Previous total knee replacement. Components appear as expected. No sign of fracture or other abnormal finding. IMPRESSION: Total knee replacement has a good appearance. Probable small knee joint effusion. Electronically Signed   By: Paulina FusiMark  Shogry M.D.   On: 01/20/2017 15:31    Procedures  Procedures (including critical care time)  Medications Ordered in ED Medications  HYDROcodone-acetaminophen (NORCO/VICODIN) 5-325 MG per tablet 2 tablet (2 tablets Oral Given 01/20/17 1810)     Initial Impression / Assessment and Plan / ED Course  I have reviewed the triage vital signs and the nursing notes.  Pertinent labs & imaging results that were available during my care of the patient were reviewed by me and considered in my medical decision making (see chart for details).     Consult: Reviewed with Dr. Jena [facility]add[facility] orthopedics.  He suggests not doing joint aspiration or injection as this is a prosthetic joint.  He recommends patient follow-up with [facility] for determination if invasive treatment is appropriate for this patient.  Final Clinical Impressions(s) / ED Diagnoses   Final diagnoses:  Effusion of right knee   Patient has history of joint replacement and as per above has periodically had pain and swelling problems with the right knee.  Patient has had recent long driving trip.  DVT is ruled out.  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  She has mild to moderate effusion without redness.  She did get good pain  improvement with 2 Vicodin.  Patient will continue to take Vicodin as needed, apply Ace bandage for comfort and follow-up with orthopedics.  Return precautions reviewed. ED Discharge Orders        Ordered    HYDROcodone-acetaminophen (NORCO/VICODIN) 5-325 MG tablet  Every 4 hours PRN     01/20/17 1953       Arby BarrettePfeiffer, Owain Eckerman, MD 01/20/17 1958

## 2018-07-09 IMAGING — CR DG KNEE COMPLETE 4+V*R*
4 series · 4 of 4 positions shown · non-contrast
Comparison: None.

CLINICAL DATA: Diffuse pain beginning yesterday.  No known injury.

EXAM:
RIGHT KNEE - COMPLETE 4+ VIEW

[t knee ap right]
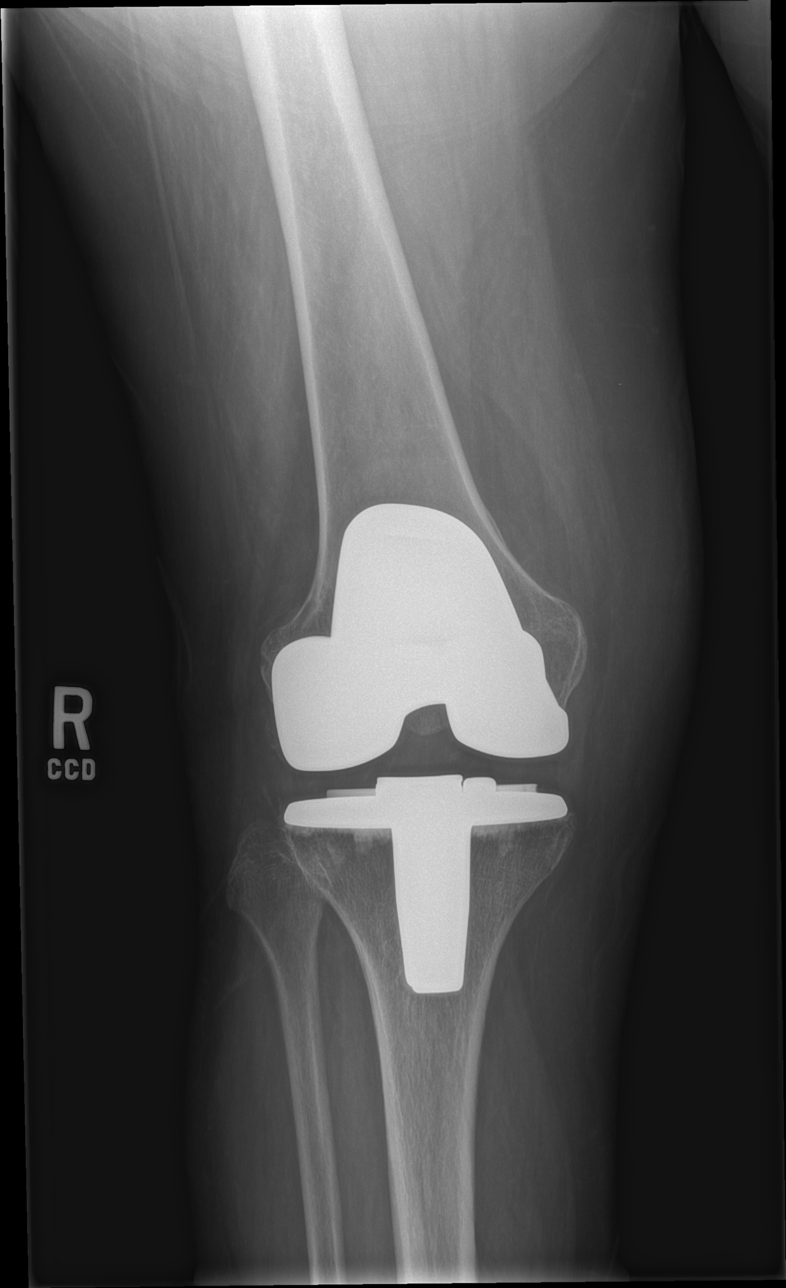

[t knee obl right (1 of 2)]
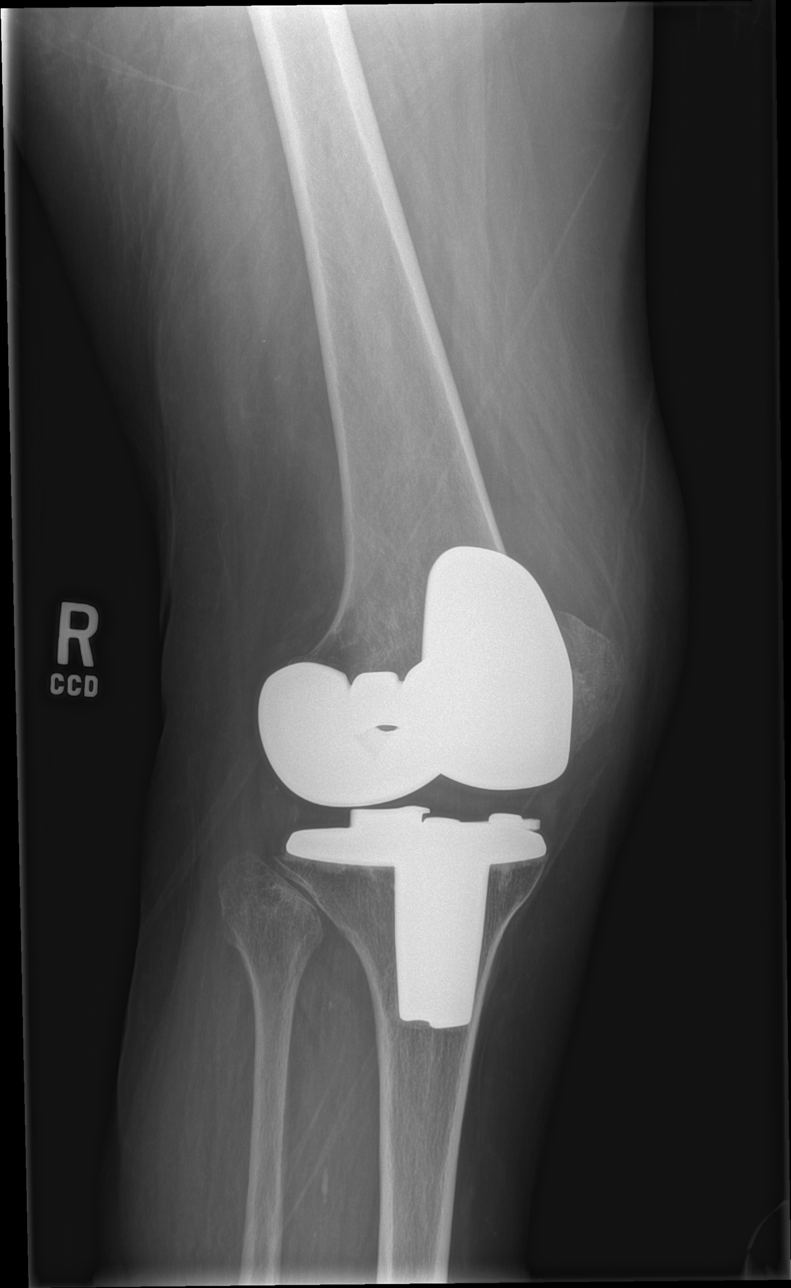

[t knee obl right (2 of 2)]
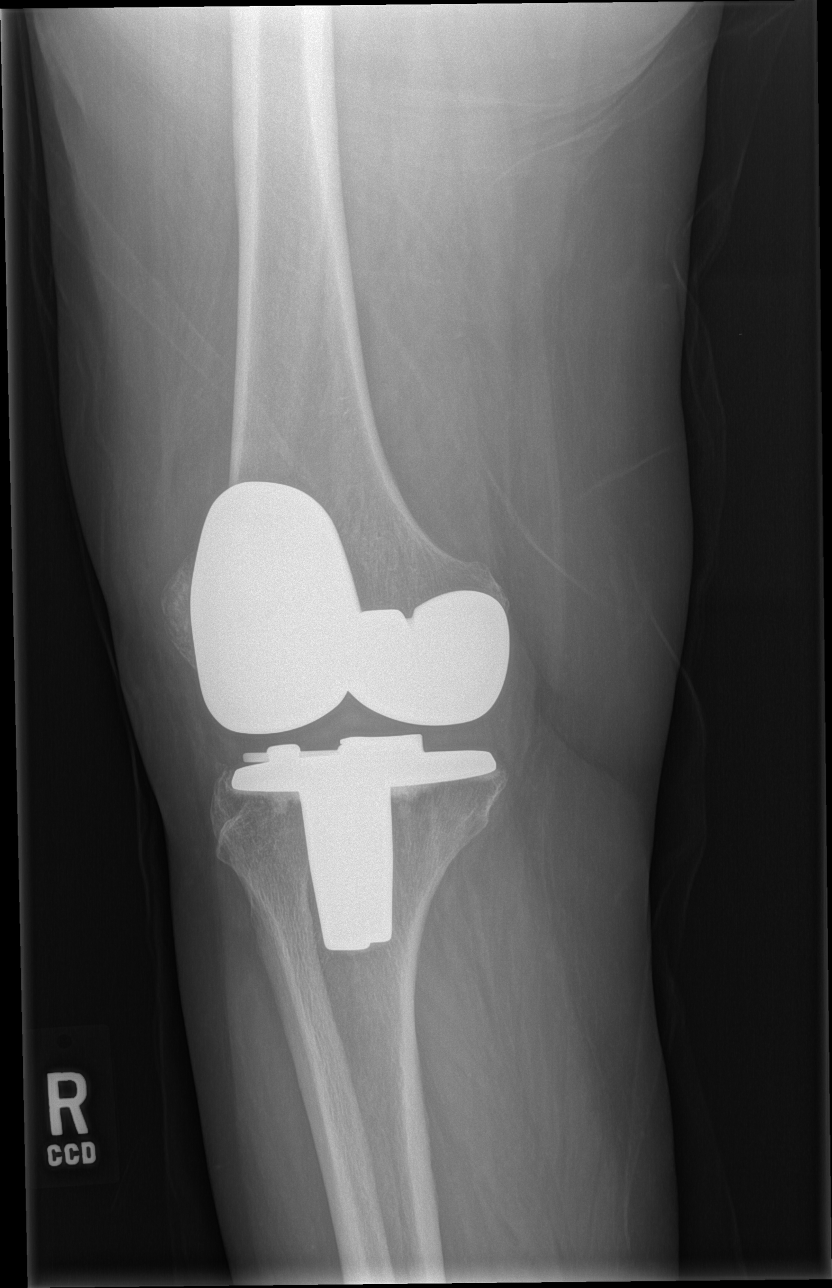

[x knee lat right]
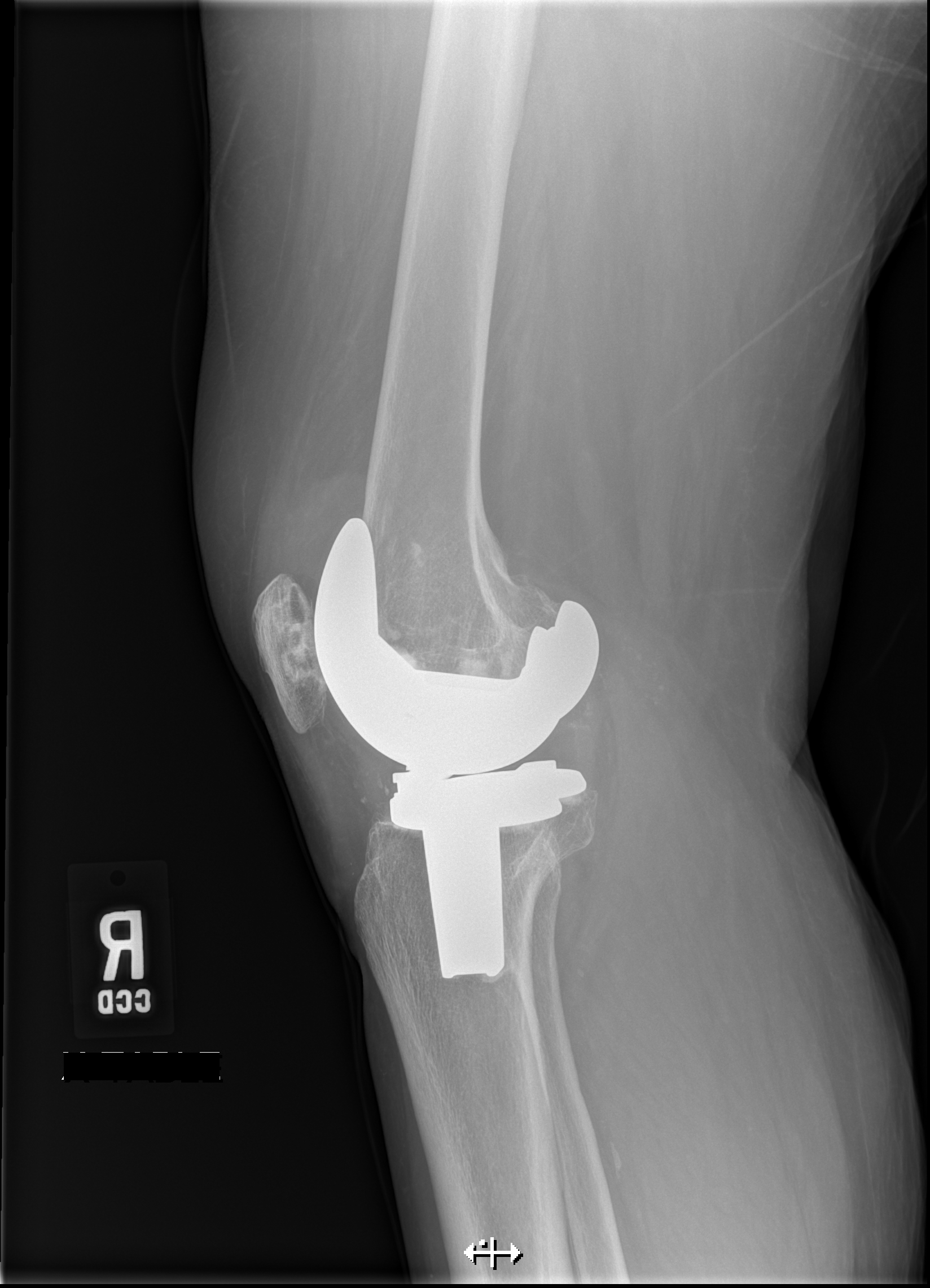

[4 of 4 positions shown; findings below may reference images not displayed]

FINDINGS: Small amount of joint fluid. Previous total knee replacement.
Components appear as expected. No sign of fracture or other abnormal
finding.
IMPRESSION: Total knee replacement has a good appearance. Probable small knee
joint effusion.

## 2024-01-01 DEATH — deceased
